# Patient Record
Sex: Male | Born: 1980 | Race: White | Hispanic: No | Marital: Single | Smoking: Never smoker
Health system: Southern US, Community
[De-identification: ages and names within clinical notes are randomized; demographics above are authoritative.]

---

## 2007-10-22 ENCOUNTER — Emergency Department: Payer: Self-pay | Admitting: Emergency Medicine

## 2013-05-18 ENCOUNTER — Emergency Department: Payer: Self-pay | Admitting: Emergency Medicine

## 2016-11-28 ENCOUNTER — Emergency Department
Admission: EM | Admit: 2016-11-28 | Discharge: 2016-11-28 | Disposition: A | Discharge status: Home / Self Care | Payer: Medicaid Other | Attending: Emergency Medicine | Admitting: Emergency Medicine

## 2016-11-28 ENCOUNTER — Encounter: Payer: Self-pay | Admitting: Emergency Medicine

## 2016-11-28 ENCOUNTER — Emergency Department: Payer: Medicaid Other

## 2016-11-28 DIAGNOSIS — R51 Headache: Secondary | ICD-10-CM | POA: Insufficient documentation

## 2016-11-28 DIAGNOSIS — R112 Nausea with vomiting, unspecified: Secondary | ICD-10-CM | POA: Diagnosis not present

## 2016-11-28 DIAGNOSIS — R519 Headache, unspecified: Secondary | ICD-10-CM

## 2016-11-28 LAB — COMPREHENSIVE METABOLIC PANEL
ALK PHOS: 71 U/L (ref 38–126)
ALT: 25 U/L (ref 17–63)
AST: 27 U/L (ref 15–41)
Albumin: 4.3 g/dL (ref 3.5–5.0)
Anion gap: 9 (ref 5–15)
BUN: 8 mg/dL (ref 6–20)
CHLORIDE: 108 mmol/L (ref 101–111)
CO2: 24 mmol/L (ref 22–32)
CREATININE: 1.2 mg/dL (ref 0.61–1.24)
Calcium: 9.8 mg/dL (ref 8.9–10.3)
GFR calc Af Amer: >60 mL/min (ref >60)
Glucose, Bld: 114 mg/dL — ABNORMAL HIGH (ref 65–99)
Potassium: 3.3 mmol/L — ABNORMAL LOW (ref 3.5–5.1)
Sodium: 141 mmol/L (ref 135–145)
Total Bilirubin: 0.7 mg/dL (ref 0.3–1.2)
Total Protein: 8.2 g/dL — ABNORMAL HIGH (ref 6.5–8.1)

## 2016-11-28 LAB — CBC WITH DIFFERENTIAL/PLATELET
BASOS ABS: 0 10*3/uL (ref 0–0.1)
Basophils Relative: 0 %
EOS PCT: 0 %
Eosinophils Absolute: 0 10*3/uL (ref 0–0.7)
HCT: 45.1 % (ref 40.0–52.0)
HEMOGLOBIN: 15.8 g/dL (ref 13.0–18.0)
LYMPHS PCT: 14 %
Lymphs Abs: 1.2 10*3/uL (ref 1.0–3.6)
MCH: 28.7 pg (ref 26.0–34.0)
MCHC: 35.1 g/dL (ref 32.0–36.0)
MCV: 81.7 fL (ref 80.0–100.0)
Monocytes Absolute: 0.4 10*3/uL (ref 0.2–1.0)
Monocytes Relative: 4 %
NEUTROS ABS: 7.2 10*3/uL — AB (ref 1.4–6.5)
NEUTROS PCT: 82 %
PLATELETS: 171 10*3/uL (ref 150–440)
RBC: 5.52 MIL/uL (ref 4.40–5.90)
RDW: 13.7 % (ref 11.5–14.5)
WBC: 8.8 10*3/uL (ref 3.8–10.6)

## 2016-11-28 MED ORDER — METOCLOPRAMIDE HCL 5 MG/ML IJ SOLN
10.0000 mg | Freq: Once | INTRAMUSCULAR | Status: AC
  Administered 2016-11-28: 10 mg via INTRAVENOUS
  Filled 2016-11-28: qty 2

## 2016-11-28 MED ORDER — KETOROLAC TROMETHAMINE 30 MG/ML IJ SOLN
30.0000 mg | Freq: Once | INTRAMUSCULAR | Status: DC
  Filled 2016-11-28: qty 1

## 2016-11-28 MED ORDER — BUTALBITAL-APAP-CAFFEINE 50-325-40 MG PO TABS: 1.0000 | ORAL_TABLET | Freq: Four times a day (QID) | ORAL | 0 refills | Status: AC | PRN

## 2016-11-28 MED ORDER — SODIUM CHLORIDE 0.9 % IV SOLN
Freq: Once | INTRAVENOUS | Status: AC
  Administered 2016-11-28: 08:00:00 via INTRAVENOUS

## 2016-11-28 MED ORDER — ONDANSETRON 4 MG PO TBDP: 4.0000 mg | ORAL_TABLET | Freq: Three times a day (TID) | ORAL | 0 refills | Status: AC | PRN

## 2016-11-28 NOTE — ED Triage Notes (Signed)
Patient ambulatory to triage with steady gait, without difficulty or distress noted; pt reports since yesterday accomp by nausea

## 2016-11-28 NOTE — ED Provider Notes (Signed)
[facility] Emergency Department Provider Note       Time seen: ----------------------------------------- 7:19 AM on 11/28/2016 -----------------------------------------     I have reviewed the triage vital signs and the nursing notes.   HISTORY   Chief Complaint Headache    HPI Devin Rose is a 36 y.o. male with no specific medical history who presents to the ED for vomiting that occurred all night last night. Patient describes a right-sided headache that starts around his right temple and goes to the back of his head. He has not taken anything for the pain, reports she has not been able to keep anything down. he does not describe light or sound bothering him, nothing makes it better or worse.  History reviewed. No pertinent past medical history.  There are no active problems to display for this patient.   History reviewed. No pertinent surgical history.  Allergies Patient has no known allergies.  Social History Social History  Substance Use Topics  . Smoking status: Never Smoker  . Smokeless tobacco: Never Used  . Alcohol use No    Review of Systems Constitutional: Negative for fever. Cardiovascular: Negative for chest pain. Respiratory: Negative for shortness of breath. Gastrointestinal: Negative for abdominal pain,positive for vomiting Genitourinary: Negative for dysuria. Musculoskeletal: Negative for back pain. Skin: Negative for rash. Neurological: positive for headache  All systems negative/normal/unremarkable except as stated in the HPI  ____________________________________________   PHYSICAL EXAM:  VITAL SIGNS: ED Triage Vitals  Enc Vitals Group     BP 11/28/16 0624 133/86     Pulse Rate 11/28/16 0624 79     Resp 11/28/16 0624 18     Temp 11/28/16 0624 98 F (36.7 C)     Temp Source 11/28/16 0624 Oral     SpO2 11/28/16 0624 100 %     Weight 11/28/16 0623 245 lb (111.1 kg)     Height 11/28/16 0623   (1.854 m)     Head Circumference --      Peak Flow --      Pain Score --      Pain Loc --      Pain Edu? --      Excl. in GC? --     Constitutional: Alert and oriented. Well appearing and in no distress. Eyes: Conjunctivae are normal. Normal extraocular movements. ENT   Head: Normocephalic and atraumatic.   Nose: No congestion/rhinnorhea.   Mouth/Throat: Mucous membranes are moist.   Neck: No stridor. Cardiovascular: Normal rate, regular rhythm. No murmurs, rubs, or gallops. Respiratory: Normal respiratory effort without tachypnea nor retractions. Breath sounds are clear and equal bilaterally. No wheezes/rales/rhonchi. Gastrointestinal: Soft and nontender. Normal bowel sounds Musculoskeletal: Nontender with normal range of motion in extremities. No lower extremity tenderness nor edema. Neurologic:  Normal speech and language. No gross focal neurologic deficits are appreciated.  Skin:  Skin is warm, dry and intact. No rash noted. Psychiatric: Mood and affect are normal. Speech and behavior are normal.  ____________________________________________  ED COURSE:  Pertinent labs & imaging results that were available during my care of the patient were reviewed by me and considered in my medical decision making (see chart for details). Patient presents for headache and vomiting, we will assess with labs and imaging as indicated.   Procedures ____________________________________________   LABS (pertinent positives/negatives)  Labs Reviewed  CBC WITH DIFFERENTIAL/PLATELET - Abnormal; Notable for the following:       Result Value   Neutro Abs 7.2 (*)  All other components within normal limits  COMPREHENSIVE METABOLIC PANEL - Abnormal; Notable for the following:    Potassium 3.3 (*)    Glucose, Bld 114 (*)    Total Protein 8.2 (*)    All other components within normal limits  CBG MONITORING, ED    RADIOLOGY  CT head  IMPRESSION: Negative head  CT. ____________________________________________  DIFFERENTIAL DIAGNOSIS   tension headache, migraine, cluster headache, subarachnoid hemorrhage, head injury   FINAL ASSESSMENT AND PLAN  headache, vomiting   Plan: Patient had presented for vomiting all night and a right-sided headache. Patients labs were negative for acute abnormality or leukocytosis. Patients imaging was negative for acute process such as bleed or infarction. His difficult to ascertain if he has a migraine or the vomiting is unrelated or even the cause of his headache. Overall his symptoms have completely resolved and he'll be discharged with antiemetics and headache medication if needed.   Emily Filbert, MD   Note: This note was generated in part or whole with voice recognition software. Voice recognition is usually quite accurate but there are transcription errors that can and very often do occur. I apologize for any typographical errors that were not detected and corrected.     Emily Filbert, MD 11/28/16 [phone]

## 2017-01-31 ENCOUNTER — Emergency Department
Admission: EM | Admit: 2017-01-31 | Discharge: 2017-01-31 | Disposition: A | Discharge status: Home / Self Care | Payer: Medicaid Other | Attending: Emergency Medicine | Admitting: Emergency Medicine

## 2017-01-31 ENCOUNTER — Other Ambulatory Visit: Payer: Self-pay

## 2017-01-31 DIAGNOSIS — K029 Dental caries, unspecified: Secondary | ICD-10-CM | POA: Insufficient documentation

## 2017-01-31 DIAGNOSIS — K047 Periapical abscess without sinus: Secondary | ICD-10-CM | POA: Diagnosis not present

## 2017-01-31 DIAGNOSIS — K0889 Other specified disorders of teeth and supporting structures: Secondary | ICD-10-CM | POA: Diagnosis present

## 2017-01-31 MED ORDER — AMOXICILLIN-POT CLAVULANATE 875-125 MG PO TABS: 1.0000 | ORAL_TABLET | Freq: Two times a day (BID) | ORAL | 0 refills | Status: AC

## 2017-01-31 MED ORDER — OXYCODONE-ACETAMINOPHEN 5-325 MG PO TABS: 1.0000 | ORAL_TABLET | Freq: Four times a day (QID) | ORAL | 0 refills | Status: DC | PRN

## 2017-01-31 MED ORDER — LIDOCAINE VISCOUS 2 % MT SOLN
15.0000 mL | Freq: Once | OROMUCOSAL | Status: AC
  Administered 2017-01-31: 15 mL via OROMUCOSAL
  Filled 2017-01-31: qty 15

## 2017-01-31 MED ORDER — AMOXICILLIN-POT CLAVULANATE 875-125 MG PO TABS
1.0000 | ORAL_TABLET | Freq: Once | ORAL | Status: AC
  Administered 2017-01-31: 1 via ORAL
  Filled 2017-01-31: qty 1

## 2017-01-31 MED ORDER — OXYCODONE-ACETAMINOPHEN 5-325 MG PO TABS
1.0000 | ORAL_TABLET | Freq: Once | ORAL | Status: AC
  Administered 2017-01-31: 1 via ORAL
  Filled 2017-01-31: qty 1

## 2017-01-31 MED ORDER — TRAMADOL HCL 50 MG PO TABS: 50.0000 mg | ORAL_TABLET | Freq: Four times a day (QID) | ORAL | 0 refills | Status: AC | PRN

## 2017-01-31 MED ORDER — AMOXICILLIN-POT CLAVULANATE 875-125 MG PO TABS: 1.0000 | ORAL_TABLET | Freq: Two times a day (BID) | ORAL | 0 refills | Status: DC

## 2017-01-31 NOTE — ED Triage Notes (Signed)
PT in with co left sided jaw pain and swelling x 2 days ago. Has several bad teeth that he needs to have filled.

## 2017-01-31 NOTE — ED Notes (Signed)
Dr. Brown at the bedside for pt evaluation 

## 2017-01-31 NOTE — ED Provider Notes (Signed)
[facility] Emergency Department Provider Note    First MD Initiated Contact with Patient 01/31/17 [phone]     (approximate)  I have reviewed the triage vital signs and the nursing notes.   HISTORY  Chief Complaint Abscess   HPI Devin Rose is a 36 y.o. male to the emergency department 1 day history left upper premolar dental pain with adjacent mandible swelling.  Patient denies any fever no difficulty swallowing.  Patient admits to multiple dental caries for which he has a dental appointment with a "dental surgeon the beginning of next year.   Past Medical History: None  There are no active problems to display for this patient.   Past Surgical History None  Prior to Admission medications   Medication Sig Start Date End Date Taking? Authorizing Provider  butalbital-acetaminophen-caffeine (FIORICET, ESGIC) 50-325-40 MG tablet Take 1-2 tablets by mouth every 6 (six) hours as needed for headache. 11/28/16 11/28/17  Emily FilbertWilliams, Jonathan E, MD  ondansetron (ZOFRAN ODT) 4 MG disintegrating tablet Take 1 tablet (4 mg total) by mouth every 8 (eight) hours as needed for nausea or vomiting. 11/28/16   Emily FilbertWilliams, Jonathan E, MD    Allergies No Known Drug Allergies  No family history on file.  Social History Social History   Tobacco Use  . Smoking status: Never Smoker  . Smokeless tobacco: Never Used  Substance Use Topics  . Alcohol use: No  . Drug use: Not on file    Review of Systems Constitutional: No fever/chills Eyes: No visual changes. ENT: No sore throat.  Positive for multiple dental caries and pain Cardiovascular: Denies chest pain. Respiratory: Denies shortness of breath. Gastrointestinal: No abdominal pain.  No nausea, no vomiting.  No diarrhea.  No constipation. Genitourinary: Negative for dysuria. Musculoskeletal: Negative for neck pain.  Negative for back pain. Integumentary: Negative for rash. Neurological: Negative for  headaches, focal weakness or numbness.   ____________________________________________   PHYSICAL EXAM:  VITAL SIGNS: ED Triage Vitals  Enc Vitals Group     BP 01/31/17 0538 111/61     Pulse Rate 01/31/17 0538 62     Resp 01/31/17 0538 20     Temp 01/31/17 0538 (!) 97.5 F (36.4 C)     Temp Source 01/31/17 0538 Oral     SpO2 01/31/17 0538 97 %     Weight 01/31/17 0539 108.9 kg (240 lb)     Height 01/31/17 0539 1.854 m (6\' 1" )     Head Circumference --      Peak Flow --      Pain Score 01/31/17 0538 10     Pain Loc --      Pain Edu? --      Excl. in GC? --     Constitutional: Alert and oriented. Well appearing and in no acute distress. Eyes: Conjunctivae are normal. Head: Atraumatic. Mouth/Throat: Mucous membranes are moist. Multiple dental caries with left premolar abscess. Neck: No stridor.  Cardiovascular: Normal rate, regular rhythm. Good peripheral circulation. Grossly normal heart sounds. Respiratory: Normal respiratory effort.  No retractions. Lungs CTAB. Gastrointestinal: Soft and nontender. No distention.  Musculoskeletal: No lower extremity tenderness nor edema. No gross deformities of extremities. Neurologic:  Normal speech and language. No gross focal neurologic deficits are appreciated.  Skin:  Skin is warm, dry and intact. No rash noted. Psychiatric: Mood and affect are normal. Speech and behavior are normal.     Procedures   ____________________________________________   INITIAL IMPRESSION / ASSESSMENT AND PLAN /  ED COURSE  As part of my medical decision making, I reviewed the following data within the electronic MEDICAL RECORD [id number] year old male presented with above-stated history caries with dental abscess.  Patient given Augmentin in the emergency department will be prescribed the same for home. ____________________________________________  FINAL CLINICAL IMPRESSION(S) / ED DIAGNOSES  Final diagnoses:  Dental abscess  Dental caries      MEDICATIONS GIVEN DURING THIS VISIT:  Medications  amoxicillin-clavulanate (AUGMENTIN) 875-125 MG per tablet 1 tablet (not administered)  lidocaine (XYLOCAINE) 2 % viscous mouth solution 15 mL (not administered)  oxyCODONE-acetaminophen (PERCOCET/ROXICET) 5-325 MG per tablet 1 tablet (not administered)     ED Discharge Orders    None       Note:  This document was prepared using Dragon voice recognition software and may include unintentional dictation errors.    Darci CurrentBrown, [place], MD 01/31/17 [phone]

## 2017-01-31 NOTE — ED Notes (Signed)
Left lower jaw pain and swelling for the past 2-3 days. Pt states he has "some bad teeth over there". Obvious swelling noted. Hx of the same.

## 2017-01-31 NOTE — ED Notes (Signed)
Tooth pain and needing dental work

## 2018-07-05 IMAGING — CT CT HEAD W/O CM
3 series · 16 of 46 positions shown, 19 images · non-contrast
Comparison: None.

CLINICAL DATA: 35-year-old with right posterior headache.

EXAM:
CT HEAD WITHOUT CONTRAST
TECHNIQUE: Contiguous axial images were obtained from the base of the skull
through the vertex without intravenous contrast.

[Series 2: head wo · axial · 0.41mm/px · z∈[-100,+20]mm · 10 of 29 slices shown, 13 images]
[im 3/29  brain]
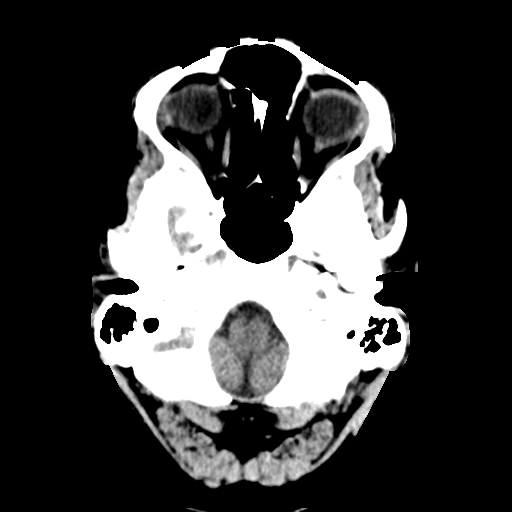
[im 3/29  bone]
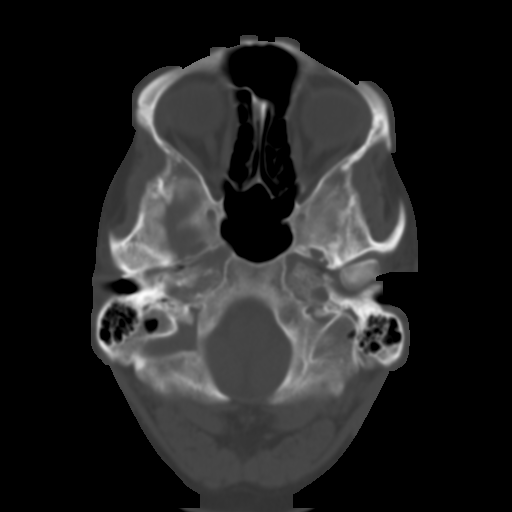
[im 6/29  brain]
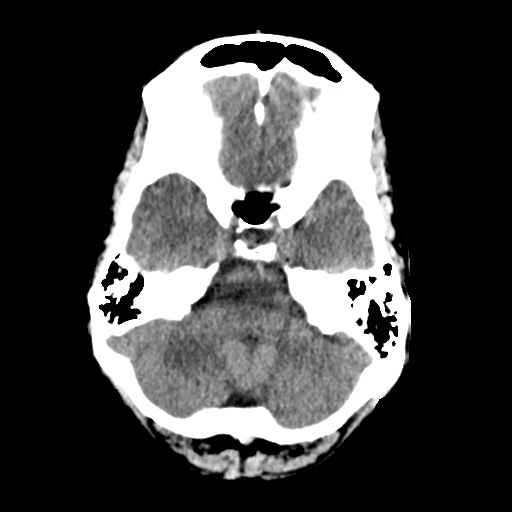
[im 8/29  brain]
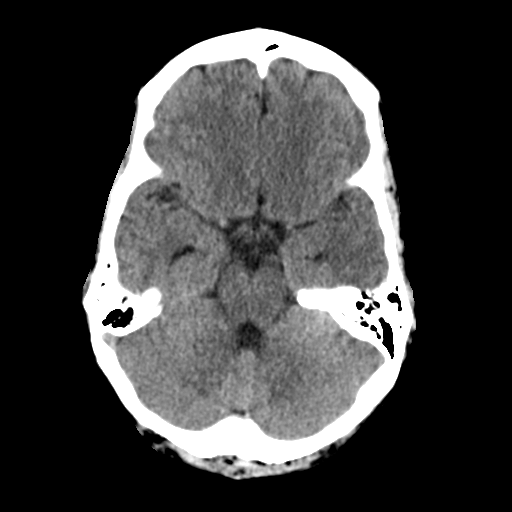
[im 11/29  brain]
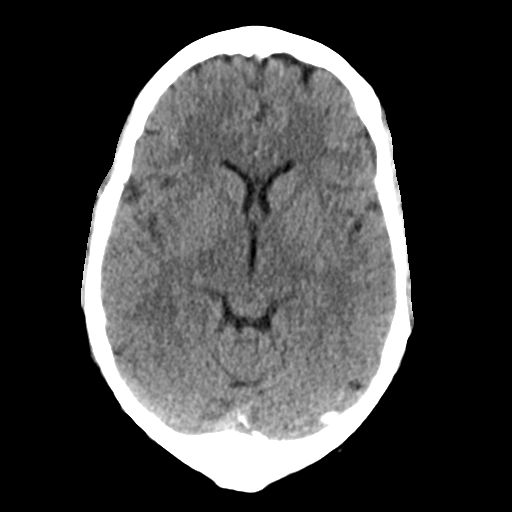
[im 14/29  brain]
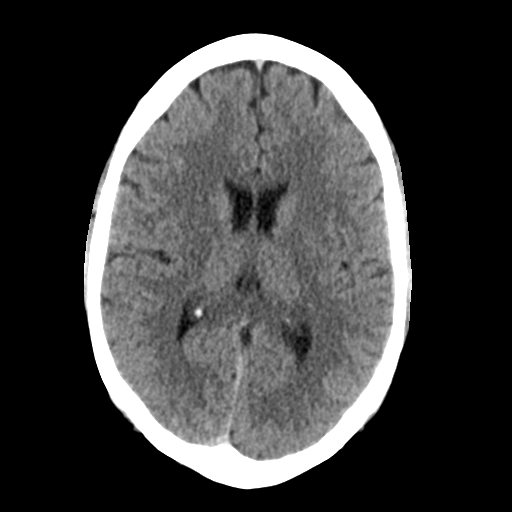
[im 14/29  bone]
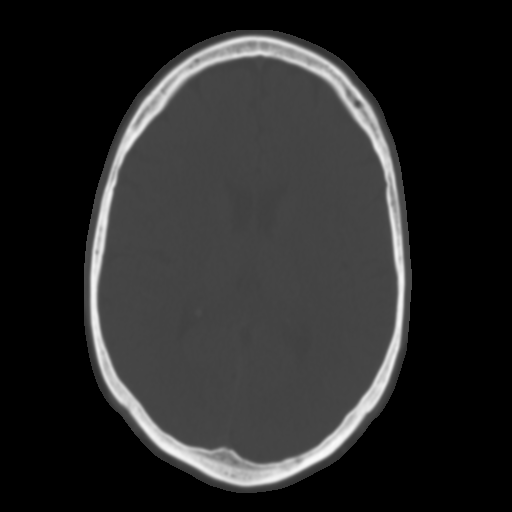
[im 16/29  brain]
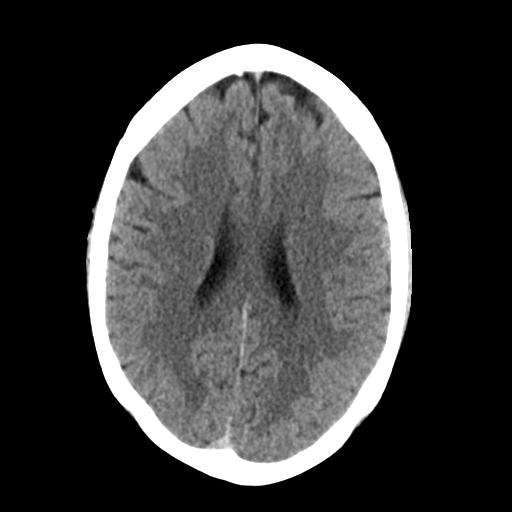
[im 19/29  brain]
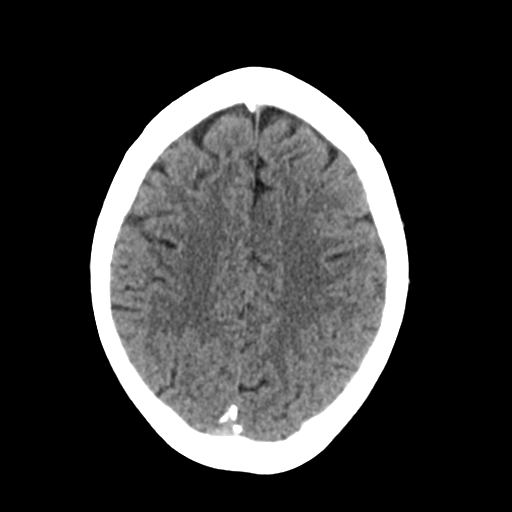
[im 22/29  brain]
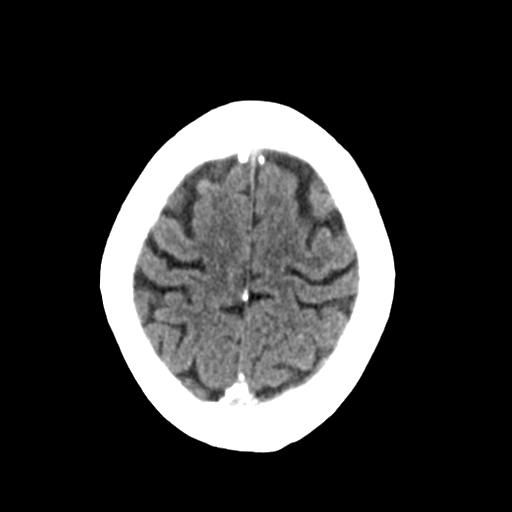
[im 24/29  brain]
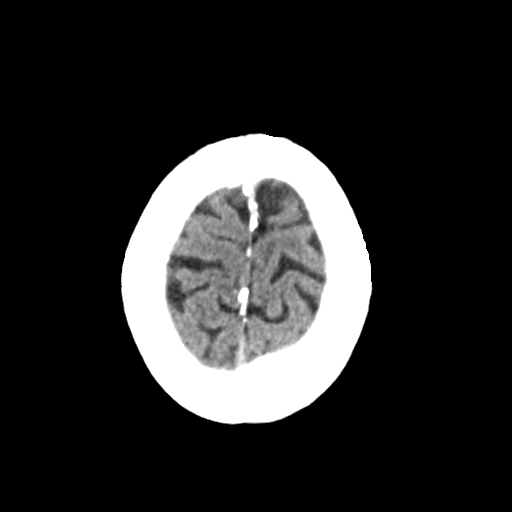
[im 24/29  bone]
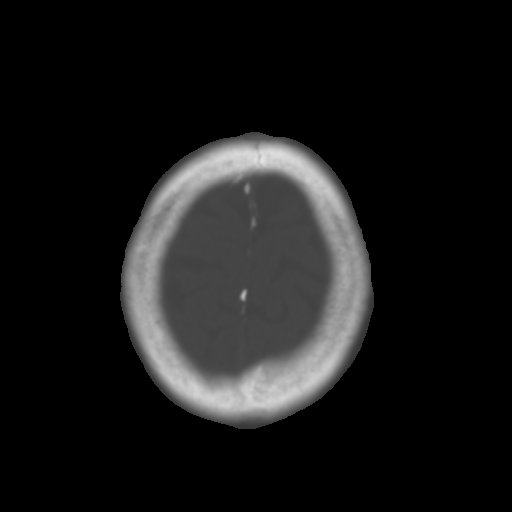
[im 27/29  brain]
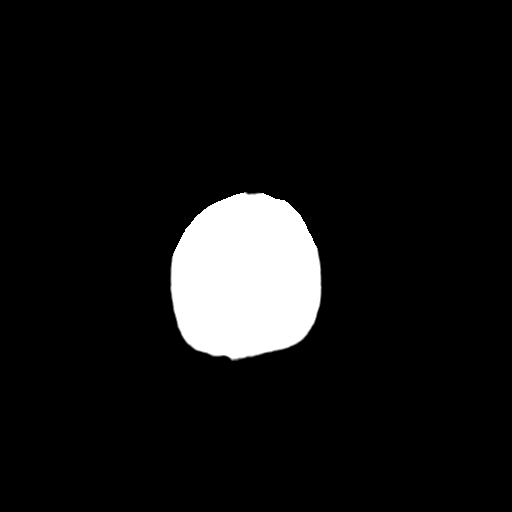

[Series 4: coronal soft tissue · coronal · 0.29mm/px · 3 of 67 slices shown]
[im 23/67  brain]
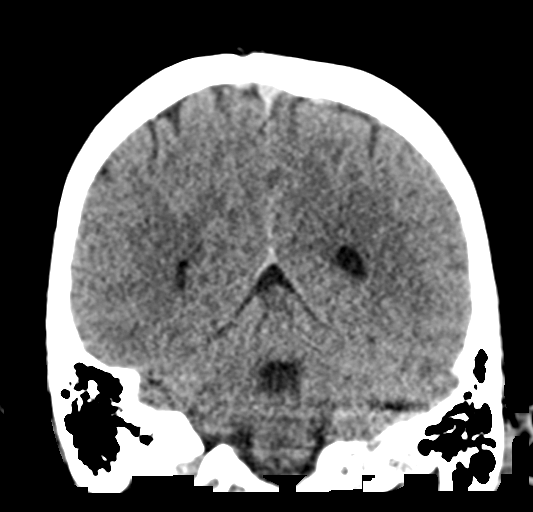
[im 30/67  brain]
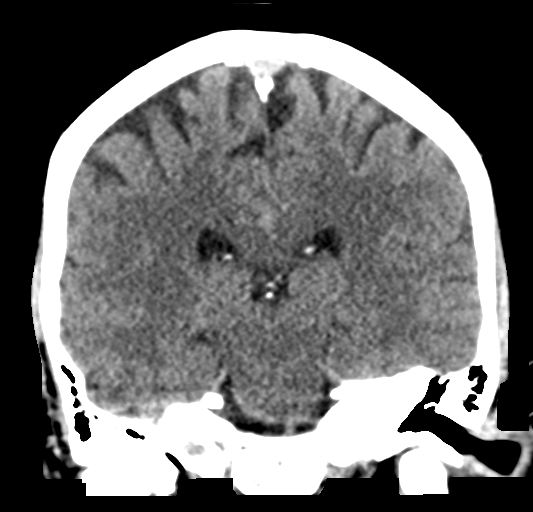
[im 37/67  brain]
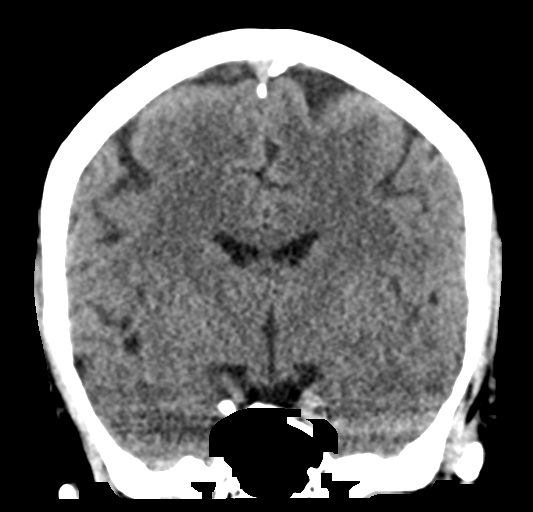

[Series 5: sagittal soft tissue · sagittal · 0.30mm/px · 3 of 50 slices shown]
[im 17/50  brain]
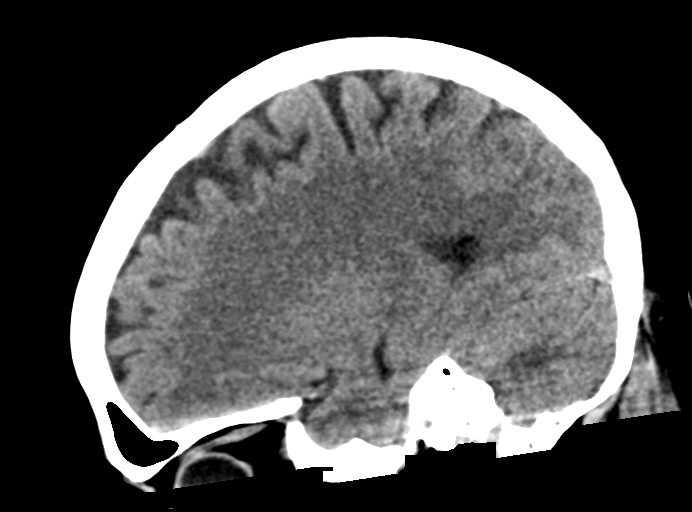
[im 25/50  brain]
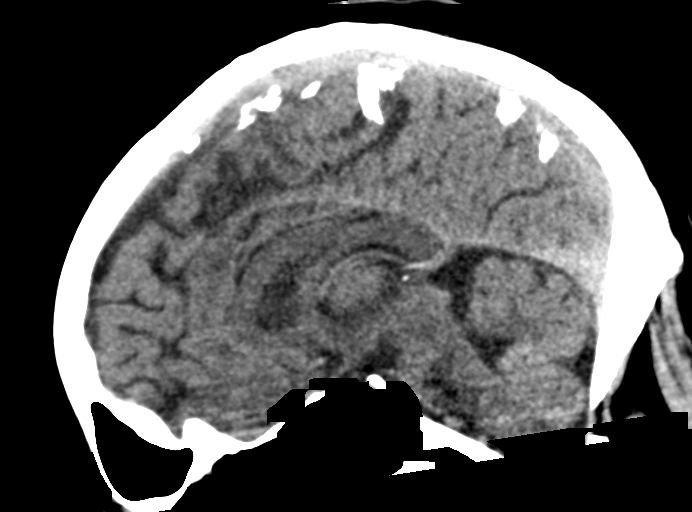
[im 33/50  brain]
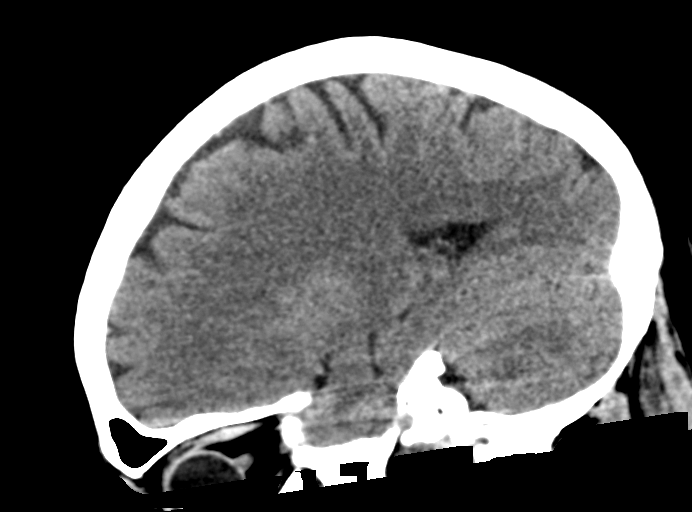

[16 of 46 positions shown; findings below may reference images not displayed]

FINDINGS: Brain: No evidence of acute infarction, hemorrhage, hydrocephalus,
extra-axial collection or mass lesion/mass effect.

Vascular: No hyperdense vessel or unexpected calcification.

Skull: Normal. Negative for fracture or focal lesion.

Sinuses/Orbits: No acute finding.

Other: None.
IMPRESSION: Negative head CT.
